# Patient Record
Sex: Male | Born: 1985 | Race: White | Hispanic: No | Marital: Married | Smoking: Current every day smoker
Health system: Southern US, Community
[De-identification: ages and names within clinical notes are randomized; demographics above are authoritative.]

## PROBLEM LIST (undated history)

## (undated) DIAGNOSIS — F419 Anxiety disorder, unspecified: Secondary | ICD-10-CM

## (undated) HISTORY — PX: BACK SURGERY: SHX140

## (undated) HISTORY — DX: Anxiety disorder, unspecified: F41.9

---

## 2015-05-16 DIAGNOSIS — G8929 Other chronic pain: Secondary | ICD-10-CM | POA: Insufficient documentation

## 2015-12-17 DIAGNOSIS — R251 Tremor, unspecified: Secondary | ICD-10-CM | POA: Insufficient documentation

## 2016-01-06 DIAGNOSIS — M5136 Other intervertebral disc degeneration, lumbar region: Secondary | ICD-10-CM | POA: Insufficient documentation

## 2016-01-06 DIAGNOSIS — G43009 Migraine without aura, not intractable, without status migrainosus: Secondary | ICD-10-CM | POA: Insufficient documentation

## 2019-09-27 DIAGNOSIS — R509 Fever, unspecified: Secondary | ICD-10-CM | POA: Diagnosis not present

## 2019-09-27 DIAGNOSIS — R0981 Nasal congestion: Secondary | ICD-10-CM | POA: Diagnosis not present

## 2019-09-27 DIAGNOSIS — Z20822 Contact with and (suspected) exposure to covid-19: Secondary | ICD-10-CM | POA: Diagnosis not present

## 2020-04-24 DIAGNOSIS — J069 Acute upper respiratory infection, unspecified: Secondary | ICD-10-CM | POA: Diagnosis not present

## 2020-04-24 DIAGNOSIS — J040 Acute laryngitis: Secondary | ICD-10-CM | POA: Diagnosis not present

## 2021-02-26 DIAGNOSIS — R0981 Nasal congestion: Secondary | ICD-10-CM | POA: Diagnosis not present

## 2021-02-26 DIAGNOSIS — J069 Acute upper respiratory infection, unspecified: Secondary | ICD-10-CM | POA: Diagnosis not present

## 2021-02-26 DIAGNOSIS — H938X1 Other specified disorders of right ear: Secondary | ICD-10-CM | POA: Diagnosis not present

## 2021-04-11 ENCOUNTER — Other Ambulatory Visit: Payer: Self-pay

## 2021-04-11 ENCOUNTER — Emergency Department (HOSPITAL_BASED_OUTPATIENT_CLINIC_OR_DEPARTMENT_OTHER)
Admission: EM | Admit: 2021-04-11 | Discharge: 2021-04-11 | Disposition: A | Discharge status: Home / Self Care | Payer: BC Managed Care – PPO | Attending: Student | Admitting: Student

## 2021-04-11 ENCOUNTER — Emergency Department (HOSPITAL_BASED_OUTPATIENT_CLINIC_OR_DEPARTMENT_OTHER): Payer: BC Managed Care – PPO

## 2021-04-11 ENCOUNTER — Encounter (HOSPITAL_BASED_OUTPATIENT_CLINIC_OR_DEPARTMENT_OTHER): Payer: Self-pay | Admitting: *Deleted

## 2021-04-11 DIAGNOSIS — F419 Anxiety disorder, unspecified: Secondary | ICD-10-CM | POA: Diagnosis not present

## 2021-04-11 DIAGNOSIS — F17210 Nicotine dependence, cigarettes, uncomplicated: Secondary | ICD-10-CM | POA: Insufficient documentation

## 2021-04-11 DIAGNOSIS — R079 Chest pain, unspecified: Secondary | ICD-10-CM | POA: Diagnosis not present

## 2021-04-11 DIAGNOSIS — X509XXA Other and unspecified overexertion or strenuous movements or postures, initial encounter: Secondary | ICD-10-CM | POA: Diagnosis not present

## 2021-04-11 DIAGNOSIS — R0789 Other chest pain: Secondary | ICD-10-CM | POA: Diagnosis not present

## 2021-04-11 LAB — CBC
HCT: 46.9 % (ref 39.0–52.0)
Hemoglobin: 15.6 g/dL (ref 13.0–17.0)
MCH: 29.2 pg (ref 26.0–34.0)
MCHC: 33.3 g/dL (ref 30.0–36.0)
MCV: 87.7 fL (ref 80.0–100.0)
Platelets: 204 10*3/uL (ref 150–400)
RBC: 5.35 MIL/uL (ref 4.22–5.81)
RDW: 13.1 % (ref 11.5–15.5)
WBC: 6.4 10*3/uL (ref 4.0–10.5)
nRBC: 0 % (ref 0.0–0.2)

## 2021-04-11 LAB — BASIC METABOLIC PANEL
Anion gap: 7 (ref 5–15)
BUN: 9 mg/dL (ref 6–20)
CO2: 28 mmol/L (ref 22–32)
Calcium: 9.2 mg/dL (ref 8.9–10.3)
Chloride: 103 mmol/L (ref 98–111)
Creatinine, Ser: 0.94 mg/dL (ref 0.61–1.24)
GFR, Estimated: >60 mL/min (ref >60)
Glucose, Bld: 78 mg/dL (ref 70–99)
Potassium: 3.8 mmol/L (ref 3.5–5.1)
Sodium: 138 mmol/L (ref 135–145)

## 2021-04-11 LAB — TROPONIN I (HIGH SENSITIVITY): Troponin I (High Sensitivity): 2 ng/L (ref <18)

## 2021-04-11 MED ORDER — HYDROXYZINE HCL 25 MG PO TABS
25.0000 mg | ORAL_TABLET | Freq: Once | ORAL | Status: AC
Start: 2021-04-11 — End: 2021-04-11
  Administered 2021-04-11: 25 mg via ORAL
  Filled 2021-04-11: qty 1

## 2021-04-11 MED ORDER — HYDROXYZINE HCL 25 MG PO TABS: 25.0000 mg | ORAL_TABLET | Freq: Four times a day (QID) | ORAL | 0 refills | Status: AC

## 2021-04-11 NOTE — ED Provider Notes (Signed)
?[facility] EMERGENCY DEPARTMENT ?Provider Note ? ?CSN: [id number] ?Arrival date & time: 04/11/21 2004 ? ?Chief Complaint(s) ?Chest Pain ? ?HPI ?Ronnie Adams is a 36 y.o. male who presents emergency department for evaluation of chest pain.  Patient states that his symptoms have been present for the last week and are directly associated with increasing stress at work.  States that his pain is primarily in the left upper quadrant of the abdomen and radiates up into the chest and down the left arm.  He states that his pain is nonexertional, not associated with shortness of breath, diaphoresis, nausea or vomiting.  Denies abdominal pain, headache, fever or other systemic symptoms.  Patient states that he "just needs some help" in regards to his anxiety at work. ? ? ?Chest Pain ? ?Past Medical History ?History reviewed. No pertinent past medical history. ?There are no problems to display for this patient. ? ?Home Medication(s) ?Prior to Admission medications   ?Medication Sig Start Date End Date Taking? Authorizing Provider  ?hydrOXYzine (ATARAX) 25 MG tablet Take 1 tablet (25 mg total) by mouth every 6 (six) hours. 04/11/21  Yes Cordell Coke, Wyn ForsterMadison, MD  ?                                                                                                                                  ?Past Surgical History ?Past Surgical History:  ?Procedure Laterality Date  ? BACK SURGERY    ? ?Family History ?No family history on file. ? ?Social History ?Social History  ? ?Tobacco Use  ? Smoking status: Every Day  ?  Types: Cigarettes  ? Smokeless tobacco: Never  ?Vaping Use  ? Vaping Use: Every day  ?Substance Use Topics  ? Alcohol use: Yes  ?  Comment: rare  ? Drug use: Never  ? ?Allergies ?Duloxetine, Oxycodone-acetaminophen, and Prednisone ? ?Review of Systems ?Review of Systems  ?Cardiovascular:  Positive for chest pain.  ?Psychiatric/Behavioral:  The patient is nervous/anxious.   ? ?Physical Exam ?Vital Signs  ?I have  reviewed the triage vital signs ?BP 124/84   Pulse 74   Temp 98.1 ?F (36.7 ?C)   Resp 14   Ht 6' (1.829 m)   Wt 82.8 kg   SpO2 100%   BMI 24.76 kg/m?  ? ?Physical Exam ?Vitals and nursing note reviewed.  ?Constitutional:   ?   General: He is not in acute distress. ?   Appearance: He is well-developed.  ?HENT:  ?   Head: Normocephalic and atraumatic.  ?Eyes:  ?   Conjunctiva/sclera: Conjunctivae normal.  ?Cardiovascular:  ?   Rate and Rhythm: Normal rate and regular rhythm.  ?   Heart sounds: No murmur heard. ?Pulmonary:  ?   Effort: Pulmonary effort is normal. No respiratory distress.  ?   Breath sounds: Normal breath sounds.  ?Abdominal:  ?   Palpations: Abdomen is soft.  ?   Tenderness: There is no abdominal  tenderness.  ?Musculoskeletal:     ?   General: No swelling.  ?   Cervical back: Neck supple.  ?Skin: ?   General: Skin is warm and dry.  ?   Capillary Refill: Capillary refill takes less than 2 seconds.  ?Neurological:  ?   Mental Status: He is alert.  ?Psychiatric:     ?   Mood and Affect: Mood normal.  ? ? ?ED Results and Treatments ?Labs ?(all labs ordered are listed, but only abnormal results are displayed) ?Labs Reviewed  ?BASIC METABOLIC PANEL  ?CBC  ?TROPONIN I (HIGH SENSITIVITY)  ?TROPONIN I (HIGH SENSITIVITY)  ?                                                                                                                       ? ?Radiology ?DG Chest 2 View ? ?Result Date: 04/11/2021 ?CLINICAL DATA:  Acute chest pain. EXAM: CHEST - 2 VIEW COMPARISON:  None. FINDINGS: The cardiomediastinal silhouette is unremarkable. There is no evidence of focal airspace disease, pulmonary edema, suspicious pulmonary nodule/mass, pleural effusion, or pneumothorax. No acute bony abnormalities are identified. IMPRESSION: No active cardiopulmonary disease. Electronically Signed   By: Margarette Canada M.D.   On: 04/11/2021 20:56   ? ?Pertinent labs & imaging results that were available during my care of the patient were  reviewed by me and considered in my medical decision making (see MDM for details). ? ?Medications Ordered in ED ?Medications  ?hydrOXYzine (ATARAX) tablet 25 mg (has no administration in time range)  ?                                                               ?                                                                    ?Procedures ?Procedures ? ?(including critical care time) ? ?Medical Decision Making / ED Course ? ? ?This patient presents to the ED for concern of chest pain, this involves an extensive number of treatment options, and is a complaint that carries with it a high risk of complications and morbidity.  The differential diagnosis includes anxiety, ACS, pneumonia, precordial catch, gastritis ? ?MDM: ?Patient seen emergency department for evaluation of chest pain.  Physical exam is unremarkable.  ECG nonischemic.  Chest x-ray negative.  High-sensitivity troponin negative.  Laboratory evaluation otherwise negative.  Patient's heart score is 0.  PERC negative.  I had a long discussion with the patient about his symptoms and they appear to  be directly related to increasing stress at work.  Patient is considering finding a new job, and we had a discussion about an Atarax trial.  He was given this medication prior to discharge and a prescription for as needed Atarax was given to the patient.  He was also given resources to follow-up with a new primary care physician and the behavioral health urgent care.  Patient then discharged. ? ? ?Additional history obtained: ?-Additional history obtained from wife ?-External records from outside source obtained and reviewed including: Chart review including previous notes, labs, imaging, consultation notes ? ? ?Lab Tests: ?-I ordered, reviewed, and interpreted labs.   ?The pertinent results include:   ?Labs Reviewed  ?BASIC METABOLIC PANEL  ?CBC  ?TROPONIN I (HIGH SENSITIVITY)  ?TROPONIN I (HIGH SENSITIVITY)  ?  ? ? ?EKG  ? EKG  Interpretation ? ?Date/Time:  Saturday April 11 2021 20:20:13 EDT ?Ventricular Rate:  93 ?PR Interval:  110 ?QRS Duration: 84 ?QT Interval:  342 ?QTC Calculation: 425 ?R Axis:   89 ?Text Interpretation: Sinus rhythm with short PR Abnormal ECG No previous ECGs available Confirmed by Mylena Sedberry ([id number]) on 04/11/2021 10:20:28 PM ?  ? ?  ? ? ? ?Imaging Studies ordered: ?I ordered imaging studies including CXR ?I independently visualized and interpreted imaging. ?I agree with the radiologist interpretation ? ? ?Medicines ordered and prescription drug management: ?Meds ordered this encounter  ?Medications  ? hydrOXYzine (ATARAX) 25 MG tablet  ?  Sig: Take 1 tablet (25 mg total) by mouth every 6 (six) hours.  ?  Dispense:  12 tablet  ?  Refill:  0  ? hydrOXYzine (ATARAX) tablet 25 mg  ?  ?-I have reviewed the patients home medicines and have made adjustments as needed ? ?Critical interventions ?none ? ?Cardiac Monitoring: ?The patient was maintained on a cardiac monitor.  I personally viewed and interpreted the cardiac monitored which showed an underlying rhythm of: NSR ? ?Social Determinants of Health:  ?Factors impacting patients care include: none ? ? ?Reevaluation: ?After the interventions noted above, I reevaluated the patient and found that they have :improved ? ?Co morbidities that complicate the patient evaluation ?History reviewed. No pertinent past medical history.  ? ? ?Dispostion: ?I considered admission for this patient, but with negative heart score, PERC negative and symptoms directly related to stress at work, he is safe for discharge with outpatient follow-up. ? ? ? ? ?Final Clinical Impression(s) / ED Diagnoses ?Final diagnoses:  ?Atypical chest pain  ?Anxiety  ? ? ? ?@PCDICTATION @ ? ?  ?Teressa Lower, MD ?04/11/21 2251 ? ?

## 2021-04-11 NOTE — ED Triage Notes (Signed)
Pt reports chest pain (points to LUQ of abdomen). States pain is sharp. Also states he has been having left arm/shoulder pain. Also intermittent episodes of "tingling" on left side of face over the last 2 weeks (none at present) ?

## 2021-04-13 ENCOUNTER — Other Ambulatory Visit: Payer: Self-pay

## 2021-04-13 ENCOUNTER — Ambulatory Visit: Payer: BC Managed Care – PPO | Admitting: Nurse Practitioner

## 2021-04-13 ENCOUNTER — Encounter: Payer: Self-pay | Admitting: Nurse Practitioner

## 2021-04-13 VITALS — BP 135/84 | HR 92 | Temp 97.9°F | Ht 72.0 in | Wt 186.0 lb

## 2021-04-13 DIAGNOSIS — F419 Anxiety disorder, unspecified: Secondary | ICD-10-CM

## 2021-04-13 MED ORDER — ESCITALOPRAM OXALATE 10 MG PO TABS: 10.0000 mg | ORAL_TABLET | Freq: Every day | ORAL | 0 refills | Status: DC

## 2021-04-13 NOTE — Patient Instructions (Signed)
1. Anxiety ? ?- escitalopram (LEXAPRO) 10 MG tablet; Take 1 tablet (10 mg total) by mouth daily.  Dispense: 30 tablet; Refill: 0 ? ?Will scheduled counseling ASAP ? ?Stay active ? ?Stay well hydrated ? ?Follow up: ? ?Follow up within 1 week for anxiety with Dr. [place] Pulling or Amy ? ?Generalized Anxiety Disorder, Adult ?Generalized anxiety disorder (GAD) is a mental health condition. Unlike normal worries, anxiety related to GAD is not triggered by a specific event. These worries do not fade or get better with time. GAD interferes with relationships, work, and school. ?GAD symptoms can vary from mild to severe. People with severe GAD can have intense waves of anxiety with physical symptoms that are similar to panic attacks. ?What are the causes? ?The exact cause of GAD is not known, but the following are believed to have an impact: ?Differences in natural brain chemicals. ?Genes passed down from parents to children. ?Differences in the way threats are perceived. ?Development and stress during childhood. ?Personality. ?What increases the risk? ?The following factors may make you more likely to develop this condition: ?Being male. ?Having a family history of anxiety disorders. ?Being very shy. ?Experiencing very stressful life events, such as the death of a loved one. ?Having a very stressful family environment. ?What are the signs or symptoms? ?People with GAD often worry excessively about many things in their lives, such as their health and family. Symptoms may also include: ?Mental and emotional symptoms: ?Worrying excessively about natural disasters. ?Fear of being late. ?Difficulty concentrating. ?Fears that others are judging your performance. ?Physical symptoms: ?Fatigue. ?Headaches, muscle tension, muscle twitches, trembling, or feeling shaky. ?Feeling like your heart is pounding or beating very fast. ?Feeling out of breath or like you cannot take a deep breath. ?Having trouble falling asleep or staying asleep, or  experiencing restlessness. ?Sweating. ?Nausea, diarrhea, or irritable bowel syndrome (IBS). ?Behavioral symptoms: ?Experiencing erratic moods or irritability. ?Avoidance of new situations. ?Avoidance of people. ?Extreme difficulty making decisions. ?How is this diagnosed? ?This condition is diagnosed based on your symptoms and medical history. You will also have a physical exam. Your health care provider may perform tests to rule out other possible causes of your symptoms. ?To be diagnosed with GAD, a person must have anxiety that: ?Is out of his or her control. ?Affects several different aspects of his or her life, such as work and relationships. ?Causes distress that makes him or her unable to take part in normal activities. ?Includes at least three symptoms of GAD, such as restlessness, fatigue, trouble concentrating, irritability, muscle tension, or sleep problems. ?Before your health care provider can confirm a diagnosis of GAD, these symptoms must be present more days than they are not, and they must last for 6 months or longer. ?How is this treated? ?This condition may be treated with: ?Medicine. Antidepressant medicine is usually prescribed for long-term daily control. Anti-anxiety medicines may be added in severe cases, especially when panic attacks occur. ?Talk therapy (psychotherapy). Certain types of talk therapy can be helpful in treating GAD by providing support, education, and guidance. Options include: ?Cognitive behavioral therapy (CBT). People learn coping skills and self-calming techniques to ease their physical symptoms. They learn to identify unrealistic thoughts and behaviors and to replace them with more appropriate thoughts and behaviors. ?Acceptance and commitment therapy (ACT). This treatment teaches people how to be mindful as a way to cope with unwanted thoughts and feelings. ?Biofeedback. This process trains you to manage your body's response (physiological response) through breathing  techniques  and relaxation methods. You will work with a therapist while machines are used to monitor your physical symptoms. ?Stress management techniques. These include yoga, meditation, and exercise. ?A mental health specialist can help determine which treatment is best for you. Some people see improvement with one type of therapy. However, other people require a combination of therapies. ?Follow these instructions at home: ?Lifestyle ?Maintain a consistent routine and schedule. ?Anticipate stressful situations. Create a plan and allow extra time to work with your plan. ?Practice stress management or self-calming techniques that you have learned from your therapist or your health care provider. ?Exercise regularly and spend time outdoors. ?Eat a healthy diet that includes plenty of vegetables, fruits, whole grains, low-fat dairy products, and lean protein. ?Do not eat a lot of foods that are high in fat, added sugar, or salt (sodium). ?Drink plenty of water. ?Avoid alcohol. Alcohol can increase anxiety. ?Avoid caffeine and certain over-the-counter cold medicines. These may make you feel worse. Ask your pharmacist which medicines to avoid. ?General instructions ?Take over-the-counter and prescription medicines only as told by your health care provider. ?Understand that you are likely to have setbacks. Accept this and be kind to yourself as you persist to take better care of yourself. ?Anticipate stressful situations. Create a plan and allow extra time to work with your plan. ?Recognize and accept your accomplishments, even if you judge them as small. ?Spend time with people who care about you. ?Keep all follow-up visits. This is important. ?Where to find more information ?[employer] of Mental Health: https://carter.com/ ?Substance Abuse and Mental Health Services: ktimeonline.com ?Contact a health care provider if: ?Your symptoms do not get better. ?Your symptoms get worse. ?You have signs of depression, such  as: ?A persistently sad or irritable mood. ?Loss of enjoyment in activities that used to bring you joy. ?Change in weight or eating. ?Changes in sleeping habits. ?Get help right away if: ?You have thoughts about hurting yourself or others. ?If you ever feel like you may hurt yourself or others, or have thoughts about taking your own life, get help right away. Go to your nearest emergency department or: ?Call your local emergency services (911 in the [place] ?Call a suicide crisis helpline, such as the [facility] at 402-187-4873 or 988 in the [place]. This is open 24 hours a day in the [place] ?Text the Crisis Text Line at 432-047-7732 (in the [place].). ?Summary ?Generalized anxiety disorder (GAD) is a mental health condition that involves worry that is not triggered by a specific event. ?People with GAD often worry excessively about many things in their lives, such as their health and family. ?GAD may cause symptoms such as restlessness, trouble concentrating, sleep problems, frequent sweating, nausea, diarrhea, headaches, and trembling or muscle twitching. ?A mental health specialist can help determine which treatment is best for you. Some people see improvement with one type of therapy. However, other people require a combination of therapies. ?This information is not intended to replace advice given to you by your health care provider. Make sure you discuss any questions you have with your health care provider. ?Document Revised: 08/06/2020 Document Reviewed: 05/04/2020 ?Elsevier Patient Education ? 2022 [place]. ? ? ?

## 2021-04-13 NOTE — Progress Notes (Signed)
@Patient  ID: , male    DOB: Feb 09, 1985, 36 y.o.   MRN: [id number] ? ?Chief Complaint  ?Patient presents with  ? Hospitalization Follow-up  ?  Atypical chest pain/anxiety  ? ? ?Referring provider: ?No ref. provider found ? ? ?HPI ? ?Patient presents today with anxiety.  He states that this has been progressively worsening over the last 3 to 4 weeks.  He states that his stress and anxiety is job-related.  He also states that he recently lost his grandmother about a week ago.  He states this got to the point where it is hard to cope with anxiety at work.  He is reaching out for help because he is worried that he may harm someone.  We discussed that we can start him on medication for anxiety and set him up with counseling.  We discussed that he may need to start looking for a new job. Denies f/c/s, n/v/d, hemoptysis, PND, chest pain or edema. ? ? ? ? ?Allergies  ?Allergen Reactions  ? Duloxetine Rash  ?  Other reaction(s): Other ?Jittery/incontinence ?Jittery/incontinence ?Jittery/incontinence ?  ? Oxycodone-Acetaminophen Nausea Only  ?  Other reaction(s): Dizziness  ? Prednisone Anxiety  ?  Other reaction(s): Other ?"head feels funny/flush" ?"head feels funny/flush" ?"head feels funny/flush" ?"head feels funny/flush" ?  ? ? ? ?There is no immunization history on file for this patient. ? ?History reviewed. No pertinent past medical history. ? ?Tobacco History: ?Social History  ? ?Tobacco Use  ?Smoking Status Every Day  ? Types: Cigarettes  ?Smokeless Tobacco Never  ? ?Ready to quit: Not Answered ?Counseling given: Not Answered ? ? ?Outpatient Encounter Medications as of 04/13/2021  ?Medication Sig  ? escitalopram (LEXAPRO) 10 MG tablet Take 1 tablet (10 mg total) by mouth daily.  ? hydrOXYzine (ATARAX) 25 MG tablet Take 1 tablet (25 mg total) by mouth every 6 (six) hours.  ? ?No facility-administered encounter medications on file as of 04/13/2021.  ? ? ? ?Review of Systems ? ?Review of Systems   ?Constitutional: Negative.   ?HENT: Negative.    ?Cardiovascular: Negative.   ?Gastrointestinal: Negative.   ?Allergic/Immunologic: Negative.   ?Neurological: Negative.   ?Psychiatric/Behavioral:  The patient is nervous/anxious.    ? ? ? ?Physical Exam ? ?BP 135/84 (BP Location: Left Arm, Patient Position: Sitting, Cuff Size: Normal)   Pulse 92   Temp 97.9 ?F (36.6 ?C) (Oral)   Ht 6' (1.829 m)   Wt 186 lb (84.4 kg)   SpO2 97%   BMI 25.23 kg/m?  ? ?Wt Readings from Last 5 Encounters:  ?04/13/21 186 lb (84.4 kg)  ?04/11/21 182 lb 8.7 oz (82.8 kg)  ? ? ? ?Physical Exam ?Vitals and nursing note reviewed.  ?Constitutional:   ?   General: He is not in acute distress. ?   Appearance: He is well-developed.  ?Cardiovascular:  ?   Rate and Rhythm: Normal rate and regular rhythm.  ?Pulmonary:  ?   Effort: Pulmonary effort is normal.  ?   Breath sounds: Normal breath sounds.  ?Skin: ?   General: Skin is warm and dry.  ?Neurological:  ?   Mental Status: He is alert and oriented to person, place, and time.  ? ? ? ?Lab Results: ? ?CBC ?   ?Component Value Date/Time  ? WBC 6.4 04/11/2021 2102  ? RBC 5.35 04/11/2021 2102  ? HGB 15.6 04/11/2021 2102  ? HCT 46.9 04/11/2021 2102  ? PLT 204 04/11/2021 2102  ? MCV 87.7 04/11/2021 2102  ?  MCH 29.2 04/11/2021 2102  ? MCHC 33.3 04/11/2021 2102  ? RDW 13.1 04/11/2021 2102  ? ? ?BMET ?   ?Component Value Date/Time  ? NA 138 04/11/2021 2102  ? K 3.8 04/11/2021 2102  ? CL 103 04/11/2021 2102  ? CO2 28 04/11/2021 2102  ? GLUCOSE 78 04/11/2021 2102  ? BUN 9 04/11/2021 2102  ? CREATININE 0.94 04/11/2021 2102  ? CALCIUM 9.2 04/11/2021 2102  ? GFRNONAA >60 04/11/2021 2102  ? ? ?BNP ?No results found for: BNP ? ?ProBNP ?No results found for: PROBNP ? ?Imaging: ?DG Chest 2 View ? ?Result Date: 04/11/2021 ?CLINICAL DATA:  Acute chest pain. EXAM: CHEST - 2 VIEW COMPARISON:  None. FINDINGS: The cardiomediastinal silhouette is unremarkable. There is no evidence of focal airspace disease, pulmonary  edema, suspicious pulmonary nodule/mass, pleural effusion, or pneumothorax. No acute bony abnormalities are identified. IMPRESSION: No active cardiopulmonary disease. Electronically Signed   By: Harmon Pier M.D.   On: 04/11/2021 20:56   ? ? ?Assessment & Plan:  ? ?Anxiety ?- escitalopram (LEXAPRO) 10 MG tablet; Take 1 tablet (10 mg total) by mouth daily.  Dispense: 30 tablet; Refill: 0 ? ?Will scheduled counseling ASAP ? ?Stay active ? ?Stay well hydrated ? ?Follow up: ? ?Follow up within 1 week for anxiety with Dr. Andrey Campanile or Amy ? ? ? ? ?Ronnie Andrew, NP ?04/14/2021 ? ?

## 2021-04-14 ENCOUNTER — Ambulatory Visit: Payer: BC Managed Care – PPO | Admitting: Clinical

## 2021-04-14 ENCOUNTER — Encounter: Payer: Self-pay | Admitting: Nurse Practitioner

## 2021-04-14 DIAGNOSIS — F32A Depression, unspecified: Secondary | ICD-10-CM | POA: Insufficient documentation

## 2021-04-14 DIAGNOSIS — F419 Anxiety disorder, unspecified: Secondary | ICD-10-CM | POA: Insufficient documentation

## 2021-04-14 DIAGNOSIS — F4323 Adjustment disorder with mixed anxiety and depressed mood: Secondary | ICD-10-CM

## 2021-04-14 DIAGNOSIS — F4322 Adjustment disorder with anxiety: Secondary | ICD-10-CM | POA: Diagnosis not present

## 2021-04-14 NOTE — Patient Instructions (Signed)
Incorporate self-care daily for at least 30 minutes ?"Don't worry about what you can't control"? ?Continue adhering to medication ?Utilize deep breathing exercises ?Place one hand on your upper chest and the other hand on your belly, below the ribcage. ?Allow your belly to relax, without forcing it inward by squeezing or clenching your muscles. ?Breathe in slowly through your nose. The air should move into your nose and downward so that you feel your stomach rise with your other hand and fall inward (toward your spine). ?Exhale slowly through slightly pursed lips. Take note of the hand on your chest, which should remain relatively still. ? ?Position your right hand by bending your pointer and middle fingers into your palm, leaving your thumb, ring finger, and pinky extended. This is known as Ambulance person in yoga. ?Close your eyes or softly gaze downward. ?Inhale and exhale to begin. ?Close off your right nostril with your thumb. ?Inhale through your left nostril. ?Close off your left nostril with your ring finger. ?Open and exhale through your right nostril. ?Inhale through your right nostril. ?Close off your right nostril with your thumb. ?Open and exhale through your left nostril. ?Inhale through your left nostril. ?

## 2021-04-14 NOTE — Assessment & Plan Note (Signed)
-   escitalopram (LEXAPRO) 10 MG tablet; Take 1 tablet (10 mg total) by mouth daily.  Dispense: 30 tablet; Refill: 0 ? ?Will scheduled counseling ASAP ? ?Stay active ? ?Stay well hydrated ? ?Follow up: ? ?Follow up within 1 week for anxiety with Dr. Andrey Campanile or Amy ?

## 2021-04-14 NOTE — Progress Notes (Signed)
? ? ?Patient ID: Ronnie Adams, male    DOB: May 29, 1985  MRN: [id number] ? ?CC: Anxiety Follow-Up  ? ?Subjective: ?Ronnie Adams is a 36 y.o. male who presents for anxiety follow-up. He is accompanied by his wife, Marylene Land.  ? ?His concerns today include:  ?ANXIETY FOLLOW-UP: ?04/13/2021 with Angus Seller, NP: ?- escitalopram (LEXAPRO) 10 MG tablet; Take 1 tablet (10 mg total) by mouth daily.  Dispense: 30 tablet; Refill: 0 ?Will scheduled counseling ASAP ?Stay active ?Stay well hydrated ?  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  Feels his leadership is asking him to do unethical things and asking him to have who he supervisors do it if he doesn't want to do it. States he doesn't feel comfortable asking anyone to do something he wouldn't do. Reports employer calling and/or texting him even when he is off the clock. Wife notices patient becomes tense when speaking about his job for example he wrings his hands.  ? ?Doesn't feel any improvement with Lexapro as of present. Reports does make him sleepy. Counseling going well. Not ready for referral to Psychiatry as of present. Denies thoughts of self-harm, suicidal ideations, homicidal ideations. Would like work letter so that he has more time to recover at home. Reports he doesn't want to do or say anything at work that he may regret stating "I do not want to blow up". Plans to have FMLA paperwork sent to primary care for completion soon.  ? ? ?  04/14/2021  ?  9:56 AM 04/13/2021  ?  1:28 PM  ?Depression screen PHQ 2/9  ?Decreased Interest 3 3  ?Down, Depressed, Hopeless 3 3  ?PHQ - 2 Score 6 6  ?Altered sleeping 3 3  ?Tired, decreased energy 3 3  ?Change in appetite 3 3  ?Feeling bad or failure about yourself  2 3  ?Trouble concentrating 2 2  ?Moving slowly or fidgety/restless 0 2  ?Suicidal thoughts 0 0  ?PHQ-9 Score 19 22  ?Difficult doing work/chores  Extremely dIfficult  ? ? ?Patient Active Problem List  ?  Diagnosis Date Noted  ? Anxiety 04/14/2021  ? Bulging lumbar disc 01/06/2016  ? Migraine without aura and without status migrainosus, not intractable 01/06/2016  ? Functional gait disorder with tremor 12/17/2015  ? Chronic bilateral low back pain with bilateral sciatica 05/16/2015  ?  ? ?Current Outpatient Medications on File Prior to Visit  ?Medication Sig Dispense Refill  ? hydrOXYzine (ATARAX) 25 MG tablet Take 1 tablet (25 mg total) by mouth every 6 (six) hours. 12 tablet 0  ? ?No current facility-administered medications on file prior to visit.  ? ? ?Allergies  ?Allergen Reactions  ? Duloxetine Rash  ?  Other reaction(s): Other ?Jittery/incontinence ?Jittery/incontinence ?Jittery/incontinence ?  ? Oxycodone-Acetaminophen Nausea Only  ?  Other reaction(s): Dizziness  ? Prednisone Anxiety  ?  Other reaction(s): Other ?"head feels funny/flush" ?"head feels funny/flush" ?"head feels funny/flush" ?"head feels funny/flush" ?  ? ? ?Social History  ? ?Socioeconomic History  ? Marital status: Married  ?  Spouse name: Not on file  ? Number of children: Not on file  ? Years of education: Not on file  ? Highest education level: Not on file  ?Occupational History  ? Not on file  ?Tobacco Use  ? Smoking status: Every Day  ?  Types: Cigarettes  ?  Passive exposure: Current  ? Smokeless tobacco: Never  ?Vaping Use  ? Vaping Use: Every day  ?  Substance and Sexual Activity  ? Alcohol use: Yes  ?  Comment: rare  ? Drug use: Never  ? Sexual activity: Not on file  ?Other Topics Concern  ? Not on file  ?Social History Narrative  ? Not on file  ? ?Social Determinants of Health  ? ?Financial Resource Strain: Not on file  ?Food Insecurity: Not on file  ?Transportation Needs: Not on file  ?Physical Activity: Not on file  ?Stress: Not on file  ?Social Connections: Not on file  ?Intimate Partner Violence: Not on file  ? ? ?No family history on file. ? ?Past Surgical History:  ?Procedure Laterality Date  ? BACK SURGERY    ? ? ?ROS: ?Review  of Systems ?Negative except as stated above ? ?PHYSICAL EXAM: ?Temp 98.3 ?F (36.8 ?C)   Ht 6' 0.01" (1.829 m)   Wt 180 lb (81.6 kg)   BMI 24.41 kg/m?  ? ?Physical Exam ?HENT:  ?   Head: Normocephalic and atraumatic.  ?Eyes:  ?   Extraocular Movements: Extraocular movements intact.  ?   Conjunctiva/sclera: Conjunctivae normal.  ?   Pupils: Pupils are equal, round, and reactive to light.  ?Cardiovascular:  ?   Rate and Rhythm: Normal rate and regular rhythm.  ?   Pulses: Normal pulses.  ?   Heart sounds: Normal heart sounds.  ?Pulmonary:  ?   Effort: Pulmonary effort is normal.  ?   Breath sounds: Normal breath sounds.  ?Musculoskeletal:  ?   Cervical back: Normal range of motion and neck supple.  ?Neurological:  ?   General: No focal deficit present.  ?   Mental Status: He is alert and oriented to person, place, and time.  ?Psychiatric:     ?   Mood and Affect: Mood normal.     ?   Behavior: Behavior normal.  ? ? ?ASSESSMENT AND PLAN: ?1. Generalized anxiety disorder: ?- Patient denies thoughts of self-harm, suicidal ideations, homicidal ideations. ?- Continue Escitalopram as prescribed. Discussed it may take 4 to 6 weeks to feel improvement of anxiety. Since patient has only been taking the same since 04/13/2021 will allow more time to see if improved.  ?- Patient declined referral to Psychiatry.  ?- Keep all scheduled appointments with Asante McCoy, LCSW.  ?- Patient provided with work letter as requested.  ?- Follow-up with primary provider in 4 weeks or sooner if needed.  ?- escitalopram (LEXAPRO) 10 MG tablet; Take 1 tablet (10 mg total) by mouth daily.  Dispense: 30 tablet; Refill: 1 ? ?2. Encounter for completion of form with patient: ?- Patient aware completion of FMLA paperwork requires appointment. Welcome to schedule when best for him. ? ? ?Patient was given the opportunity to ask questions.  Patient verbalized understanding of the plan and was able to repeat key elements of the plan. Patient was given  clear instructions to go to Emergency Department or return to medical center if symptoms don't improve, worsen, or new problems develop.The patient verbalized understanding. ? ?Requested Prescriptions  ? ?Signed Prescriptions Disp Refills  ? escitalopram (LEXAPRO) 10 MG tablet 30 tablet 1  ?  Sig: Take 1 tablet (10 mg total) by mouth daily.  ? ? ?Return in about 4 weeks (around 05/15/2021) for Follow-Up or next available anxiety; Paperwork appt when best for patient . ? ?Rema Fendt, NP  ?

## 2021-04-15 NOTE — BH Specialist Note (Signed)
Integrated Behavioral Health Initial In-Person Visit ? ?MRN: [id number] ?Name: Ronnie Adams ? ?Number of [facility] Clinician visits: 1- Initial Visit ? ?Session Start time: [phone] ?   ?Session End time: [id number] ? ?Total time in minutes: 55 ? ? ?Types of Service: Individual psychotherapy ? ?Interpretor:No. Interpretor Name and Language: N/A ? ? Warm Hand Off Completed. ?  ? ?  ? ? ?Subjective: ?Ronnie Adams is a 36 y.o. male accompanied by  self ?Patient was referred by Lazaro Arms, NP for stress and anxiety. ?Patient reports the following symptoms/concerns: Reports feeling anxious, excessive worrying, trouble relaxing, restlessness, irritability, feeling depressed, trouble sleeping, decreased energy, decreased appetite, and trouble sleeping. Reports that he has been stressed due to his job that he has been at for 5 years. Reports that he is overworked and his job frequently calls him during times that he is off. Reports that he does not get much sleep during the day when he is supposed to sleep. Reports that he is searching for a new job. ?Duration of problem: 2 weeks; Severity of problem: moderate ? ?Objective: ?Mood: Anxious and Affect: Appropriate ?Risk of harm to self or others: No plan to harm self or others ? ?Life Context: ?Family and Social: Pt is married with two children. ?School/Work: Pt is employed full time and is searching for new employment.  ?Self-Care: Pt has limited self-care. Pt currently prescribed lexapro.  ?Life Changes: Pt has been stressed due to work. ? ?Patient and/or Family's Strengths/Protective Factors: ?Concrete supports in place (healthy food, safe environments, etc.) and Sense of purpose ? ?Goals Addressed: ?Patient will: ?Reduce symptoms of: anxiety, depression, and stress ?Increase knowledge and/or ability of: coping skills and stress reduction  ?Demonstrate ability to: Increase healthy adjustment to current life circumstances ? ?Progress towards  Goals: ?Ongoing ? ?Interventions: ?Interventions utilized: Optician, dispensing, CBT Cognitive Behavioral Therapy, and Supportive Counseling  ?Standardized Assessments completed: GAD-7 and PHQ 9 ? ?  04/14/2021  ?  9:57 AM 04/13/2021  ?  1:29 PM  ?GAD 7 : Generalized Anxiety Score  ?Nervous, Anxious, on Edge 3 3  ?Control/stop worrying 3 3  ?Worry too much - different things 3 3  ?Trouble relaxing 3 3  ?Restless 3 3  ?Easily annoyed or irritable 3 3  ?Afraid - awful might happen 3 3  ?Total GAD 7 Score 21 21  ?Anxiety Difficulty  Extremely difficult  ? ?  ? ?  04/14/2021  ?  9:56 AM 04/13/2021  ?  1:28 PM  ?Depression screen PHQ 2/9  ?Decreased Interest 3 3  ?Down, Depressed, Hopeless 3 3  ?PHQ - 2 Score 6 6  ?Altered sleeping 3 3  ?Tired, decreased energy 3 3  ?Change in appetite 3 3  ?Feeling bad or failure about yourself  2 3  ?Trouble concentrating 2 2  ?Moving slowly or fidgety/restless 0 2  ?Suicidal thoughts 0 0  ?PHQ-9 Score 19 22  ?Difficult doing work/chores  Extremely dIfficult  ?  ?Patient and/or Family Response: Pt receptive to tx. Pt receptive to psychoeducation provided on stress, anxiety, depression and its association with physical health problems. Pt receptive to cognitive restructuring to decrease unhelpful thoughts. Pt receptive to identifying a pattern in pt's thoughts and behaviors. Pt receptive to daily self-care, mindfulness with deep breathing, and adhering to medication. ? ?Patient Centered Plan: ?Patient is on the following Treatment Plan(s): Stress ? ?Assessment: ?Denies SI/HI. Denies auditory/visual hallucinations. Patient currently experiencing stress which is causing anxiety and depression. Pt  appears to be overwhelmed with current employment. Pt appears to frequently put his job before his needs. Pt has limited self-care.  ?  ?Patient may benefit from establishing boundaries with place of employment. LCSW provided psychoeducation on stress, anxiety, and depression and its  association with physical health. LCSW utilized cognitive restructuring to decrease unhelpful thoughts. LCSW encouraged pt to establish healthy boundaries with job. LCSW encouraged pt to incorporate daily self-care, mindfulness with deep breathing, and adhering to medication. ? ?Plan: ?Follow up with behavioral health clinician on : 04/28/21 ?Behavioral recommendations: Adhere to medication, incorporate daily self-care, and utilize mindfulness. ?Referral(s): [place] (In Clinic) ?"From scale of 1-10, how likely are you to follow plan?": 10 ? ?Shaunta Oncale C Aashvi Rezabek, LCSW ? ? ? ? ? ? ? ? ?

## 2021-04-17 ENCOUNTER — Other Ambulatory Visit: Payer: Self-pay

## 2021-04-17 ENCOUNTER — Encounter: Payer: Self-pay | Admitting: Family

## 2021-04-17 ENCOUNTER — Ambulatory Visit: Payer: BC Managed Care – PPO | Admitting: Family

## 2021-04-17 VITALS — Temp 98.3°F | Ht 72.01 in | Wt 180.0 lb

## 2021-04-17 DIAGNOSIS — Z0289 Encounter for other administrative examinations: Secondary | ICD-10-CM

## 2021-04-17 DIAGNOSIS — F411 Generalized anxiety disorder: Secondary | ICD-10-CM | POA: Diagnosis not present

## 2021-04-17 MED ORDER — ESCITALOPRAM OXALATE 10 MG PO TABS
10.0000 mg | ORAL_TABLET | Freq: Every day | ORAL | 1 refills | Status: DC
Start: 2021-04-17 — End: 2021-05-15

## 2021-04-17 NOTE — Progress Notes (Signed)
Pt presents for anxiety follow up and needs to establish with PCP  ?

## 2021-04-20 ENCOUNTER — Telehealth: Payer: Self-pay | Admitting: Family

## 2021-04-20 NOTE — Telephone Encounter (Signed)
Pt dropped off FMLA docs and scheduled next available appt for FMLA docs to be filled w/ PCP. Docs are in PCP bin. Thank you.  ?

## 2021-04-20 NOTE — Progress Notes (Signed)
? ? ?Patient ID: Ronnie Adams, male    DOB: 1985/09/04  MRN: [id number] ? ?CC: FMLA Paperwork ? ?Subjective: ?Ronnie Adams is a 36 y.o. male who presents for FMLA paperwork.  ? ?His concerns today include:  ?Presents today with FMLA paperwork for completion. Employed at [employer] at the [place] location. 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 His schedule is 8 pm to 8 am for 36 to 48 hours weekly. Reports anxiety depression primarily related to his job.  ? ? ?  04/14/2021  ?  9:56 AM 04/13/2021  ?  1:28 PM  ?Depression screen PHQ 2/9  ?Decreased Interest 3 3  ?Down, Depressed, Hopeless 3 3  ?PHQ - 2 Score 6 6  ?Altered sleeping 3 3  ?Tired, decreased energy 3 3  ?Change in appetite 3 3  ?Feeling bad or failure about yourself  2 3  ?Trouble concentrating 2 2  ?Moving slowly or fidgety/restless 0 2  ?Suicidal thoughts 0 0  ?PHQ-9 Score 19 22  ?Difficult doing work/chores  Extremely dIfficult  ? ? ?Patient Active Problem List  ? Diagnosis Date Noted  ? Anxiety and depression 04/14/2021  ? Bulging lumbar disc 01/06/2016  ? Migraine without aura and without status migrainosus, not intractable 01/06/2016  ? Functional gait disorder with tremor 12/17/2015  ? Chronic bilateral low back pain with bilateral sciatica 05/16/2015  ?  ? ?Current Outpatient Medications on File Prior to Visit  ?Medication Sig Dispense Refill  ? escitalopram (LEXAPRO) 10 MG tablet Take 1 tablet (10 mg total) by mouth daily. 30 tablet 1  ? hydrOXYzine (ATARAX) 25 MG tablet Take 1 tablet (25 mg total) by mouth every 6 (six) hours. 12 tablet 0  ? ?No current facility-administered medications on file prior to visit.  ? ? ?Allergies  ?Allergen Reactions  ? Duloxetine Rash  ?  Other reaction(s): Other ?Jittery/incontinence ?Jittery/incontinence ?Jittery/incontinence ?  ? Oxycodone-Acetaminophen Nausea Only  ?  Other reaction(s): Dizziness  ? Prednisone Anxiety  ?  Other reaction(s): Other ?"head feels funny/flush" ?"head feels funny/flush" ?"head  feels funny/flush" ?"head feels funny/flush" ?  ? ? ?Social History  ? ?Socioeconomic History  ? Marital status: Married  ?  Spouse name: Not on file  ? Number of children: Not on file  ? Years of education: Not on file  ? Highest education level: Not on file  ?Occupational History  ? Not on file  ?Tobacco Use  ? Smoking status: Every Day  ?  Types: Cigarettes  ?  Passive exposure: Current  ? Smokeless tobacco: Never  ?Vaping Use  ? Vaping Use: Every day  ?Substance and Sexual Activity  ? Alcohol use: Yes  ?  Comment: rare  ? Drug use: Never  ? Sexual activity: Not on file  ?Other Topics Concern  ? Not on file  ?Social History Narrative  ? Not on file  ? ?Social Determinants of Health  ? ?Financial Resource Strain: Not on file  ?Food Insecurity: Not on file  ?Transportation Needs: Not on file  ?Physical Activity: Not on file  ?Stress: Not on file  ?Social Connections: Not on file  ?Intimate Partner Violence: Not on file  ? ? ?No family history on file. ? ?Past Surgical History:  ?Procedure Laterality Date  ? BACK SURGERY    ? ? ?ROS: ?Review of Systems ?Negative except as stated above ? ?PHYSICAL EXAM: ?BP 125/85 (BP Location: Left Arm, Patient Position: Sitting, Cuff Size: Normal)   Pulse 98   Temp 98.3 ?F (  36.8 ?C)   Resp 18   Ht 6' 0.01" (1.829 m)   Wt 185 lb (83.9 kg)   SpO2 98%   BMI 25.08 kg/m?  ? ?Physical Exam ?HENT:  ?   Head: Normocephalic and atraumatic.  ?Eyes:  ?   Extraocular Movements: Extraocular movements intact.  ?   Conjunctiva/sclera: Conjunctivae normal.  ?   Pupils: Pupils are equal, round, and reactive to light.  ?Cardiovascular:  ?   Rate and Rhythm: Normal rate and regular rhythm.  ?   Pulses: Normal pulses.  ?   Heart sounds: Normal heart sounds.  ?Pulmonary:  ?   Effort: Pulmonary effort is normal.  ?   Breath sounds: Normal breath sounds.  ?Musculoskeletal:  ?   Cervical back: Normal range of motion and neck supple.  ?Neurological:  ?   Mental Status: He is alert.  ?Psychiatric:      ?   Mood and Affect: Mood normal.     ?   Behavior: Behavior normal.  ? ? ?ASSESSMENT AND PLAN: ?1. Encounter for completion of form with patient: ?- FMLA paperwork for 12 weeks completed today in office.  ? ?2. Anxiety and depression: ?- Counseled patient will need Psychiatry referral to determine how often medically necessary for the employee to be absent from work on an intermittent basis (periodically) including episodes of incapacity, episodic flare-ups including frequency and duration. Explained this is needed so that FMLA can be completed in accuracy. Patient verbalized understanding.  ?- Referral to Psychiatry for further evaluation and management.  ?- Ambulatory referral to Psychiatry ? ?Patient was given the opportunity to ask questions.  Patient verbalized understanding of the plan and was able to repeat key elements of the plan. Patient was given clear instructions to go to Emergency Department or return to medical center if symptoms don't improve, worsen, or new problems develop.The patient verbalized understanding. ? ? ?Orders Placed This Encounter  ?Procedures  ? Ambulatory referral to Psychiatry  ? ? ?Requested Prescriptions  ? ? No prescriptions requested or ordered in this encounter  ? ? ?Follow-up with primary provider as scheduled. ? ?Camillia Herter, NP  ?

## 2021-04-20 NOTE — Telephone Encounter (Signed)
Pt is calling to schedule appt to go over [facility] paperwork. No available appts until 05/19/21. Pt needs to return paperwork by 05/01/21. Pt stated that he will drop off the FMLA paper to Amy. ?Please advise CB= [phone] ?

## 2021-04-22 ENCOUNTER — Other Ambulatory Visit: Payer: Self-pay

## 2021-04-22 ENCOUNTER — Ambulatory Visit (INDEPENDENT_AMBULATORY_CARE_PROVIDER_SITE_OTHER): Payer: BC Managed Care – PPO | Admitting: Family

## 2021-04-22 VITALS — BP 125/85 | HR 98 | Temp 98.3°F | Resp 18 | Ht 72.01 in | Wt 185.0 lb

## 2021-04-22 DIAGNOSIS — F32A Depression, unspecified: Secondary | ICD-10-CM

## 2021-04-22 DIAGNOSIS — F419 Anxiety disorder, unspecified: Secondary | ICD-10-CM | POA: Diagnosis not present

## 2021-04-22 DIAGNOSIS — Z0289 Encounter for other administrative examinations: Secondary | ICD-10-CM

## 2021-04-22 NOTE — Progress Notes (Signed)
Pt presents for FMLA paperwork completion 

## 2021-04-24 ENCOUNTER — Other Ambulatory Visit: Payer: Self-pay | Admitting: Family

## 2021-04-28 ENCOUNTER — Ambulatory Visit (INDEPENDENT_AMBULATORY_CARE_PROVIDER_SITE_OTHER): Payer: BC Managed Care – PPO | Admitting: Clinical

## 2021-04-28 DIAGNOSIS — F4323 Adjustment disorder with mixed anxiety and depressed mood: Secondary | ICD-10-CM | POA: Diagnosis not present

## 2021-04-28 NOTE — BH Specialist Note (Signed)
Integrated Behavioral Health Follow Up In-Person Visit ? ?MRN: [id number] ?Name: Ronnie Adams ? ?Number of Integrated Behavioral Health Clinician visits: 2- Second Visit ? ?Session Start time: 46 ?  ?Session End time: 0910 ? ?Total time in minutes: 30 ? ? ?Types of Service: Individual psychotherapy ? ?Interpretor:No. Interpretor Name and Language: N/A ? ?Subjective: ?Ronnie Adams is a 36 y.o. male accompanied by  self ?Patient was referred by Angus Seller, NP for stress and anxiety. ?Patient reports the following symptoms/concerns: Reports feeling anxious, excessive worrying, irritability, feeling depressed, trouble sleeping, decreased energy, self-esteem disturbance, and trouble concentrating. Reports that he has been taking time off from work. Reports that he is still searching for new employment.  Reports that he has seen an improvement in his sleep since he stopped working and that he has been able to spend more time with his family. Reports that he does feel bad due to not working.  ?Duration of problem: 1 month; Severity of problem: moderate ? ?Objective: ?Mood: Anxious and Affect: Appropriate ?Risk of harm to self or others: No plan to harm self or others ? ?Life Context: ?Family and Social: Pt is married with two children. ?School/Work: Pt is employed full time and is searching for new employment.  ?Self-Care: Pt has limited self-care. Pt currently prescribed lexapro.  ?Life Changes: Pt has been stressed due to work. ?(No changes to life context) ? ?Patient and/or Family's Strengths/Protective Factors: ?Concrete supports in place (healthy food, safe environments, etc.) and Sense of purpose ? ?Goals Addressed: ?Patient will: ? Reduce symptoms of: anxiety, depression, and stress  ? Increase knowledge and/or ability of: coping skills and stress reduction  ? Demonstrate ability to: Increase healthy adjustment to current life circumstances ? ?Progress towards  Goals: ?Ongoing ? ?Interventions: ?Interventions utilized:  Copywriter, advertising, CBT Cognitive Behavioral Therapy, and Supportive Counseling ?Standardized Assessments completed: GAD-7 and PHQ 9 ? ?  04/28/2021  ?  8:44 AM 04/14/2021  ?  9:57 AM 04/13/2021  ?  1:29 PM  ?GAD 7 : Generalized Anxiety Score  ?Nervous, Anxious, on Edge 3 3 3   ?Control/stop worrying 3 3 3   ?Worry too much - different things 3 3 3   ?Trouble relaxing 3 3 3   ?Restless  3 3  ?Easily annoyed or irritable 3 3 3   ?Afraid - awful might happen 3 3 3   ?Total GAD 7 Score  21 21  ?Anxiety Difficulty Extremely difficult  Extremely difficult  ? ?  ? ?  04/28/2021  ?  8:43 AM 04/14/2021  ?  9:56 AM 04/13/2021  ?  1:28 PM  ?Depression screen PHQ 2/9  ?Decreased Interest 3 3 3   ?Down, Depressed, Hopeless 3 3 3   ?PHQ - 2 Score 6 6 6   ?Altered sleeping 3 3 3   ?Tired, decreased energy 3 3 3   ?Change in appetite 3 3 3   ?Feeling bad or failure about yourself  3 2 3   ?Trouble concentrating 2 2 2   ?Moving slowly or fidgety/restless 2 0 2  ?Suicidal thoughts 0 0 0  ?PHQ-9 Score 22 19 22   ?Difficult doing work/chores   Extremely dIfficult  ?  ?Patient and/or Family Response: Pt receptive to tx. Pt receptive to psychoeducation on anxiety and depression. Pt receptive to cognitive restructuring. Pt receptive to incorporating daily self-care and identifying healthy coping skills.  ? ?Patient Centered Plan: ?Patient is on the following Treatment Plan(s): Stress ? ?Assessment: ?Denies SI/HI. Denies auditory/visual hallucinations. Patient currently experiencing stress related to work. Pt appears to  taking self-care by taking time away from work. Pt appears to have to difficulty with not work due to working since childhood.  ? ?Patient may benefit from identifying healthy coping skills. LCSW provided psychoeducation on anxiety and depression. LCSW utilized Chartered certified accountant. LCSW encouraged pt to incorporate daily self-care and identify healthy coping skill.  LCSW discussed with pt her departure from [place]. LCSW will provide pt with counseling resources whom accept pt's insurance. ? ?Plan: ?Follow up with behavioral health clinician on : 05/12/21  ?Behavioral recommendations: Identify additional healthy coping skills and incorporate daily self-care. ?Referral(s): [facility] (In Clinic) and Counselor ?"From scale of 1-10, how likely are you to follow plan?": 10 ? ?Will Schier C Cordera Stineman, LCSW ? ?    ?

## 2021-05-08 NOTE — Progress Notes (Signed)
? ? ?Patient ID: Ronnie Adams, male    DOB: 02/20/1985  MRN: [id number] ? ?CC: Anxiety Follow-Up ? ?Subjective: ?Ronnie Adams is a 37 y.o. male who presents for anxiety follow-up. He is accompanied by his wife. ? ?His concerns today include:  ? ?ANXIETY FOLLOW-UP: ?04/17/2021: ?- Continue Escitalopram as prescribed. ? ?04/22/2021: ?- Counseled patient will need Psychiatry referral to determine how often medically necessary for the employee to be absent from work on an intermittent basis (periodically) including episodes of incapacity, episodic flare-ups including frequency and duration. Explained this is needed so that FMLA can be completed in accuracy. Patient verbalized understanding.  ? ?05/15/2021: ?Doing well on current regimen, no issues/concerns. Reports has not heard from Psychiatry referral. Concerned if he needs FMLA extension past June 2023. Reports trying to find another job prior to that time.  ? ? ?  05/12/2021  ?  9:22 AM 04/28/2021  ?  8:43 AM 04/14/2021  ?  9:56 AM 04/13/2021  ?  1:28 PM  ?Depression screen PHQ 2/9  ?Decreased Interest 3 3 3 3   ?Down, Depressed, Hopeless 3 3 3 3   ?PHQ - 2 Score 6 6 6 6   ?Altered sleeping 3 3 3 3   ?Tired, decreased energy 3 3 3 3   ?Change in appetite 3 3 3 3   ?Feeling bad or failure about yourself  3 3 2 3   ?Trouble concentrating 2 2 2 2   ?Moving slowly or fidgety/restless 2 2 0 2  ?Suicidal thoughts 0 0 0 0  ?PHQ-9 Score 22 22 19 22   ?Difficult doing work/chores    Extremely dIfficult  ? ? ?Patient Active Problem List  ? Diagnosis Date Noted  ? Anxiety and depression 04/14/2021  ? Bulging lumbar disc 01/06/2016  ? Migraine without aura and without status migrainosus, not intractable 01/06/2016  ? Functional gait disorder with tremor 12/17/2015  ? Chronic bilateral low back pain with bilateral sciatica 05/16/2015  ?  ? ?Current Outpatient Medications on File Prior to Visit  ?Medication Sig Dispense Refill  ? hydrOXYzine (ATARAX) 25 MG tablet Take 1 tablet (25 mg total)  by mouth every 6 (six) hours. 12 tablet 0  ? ?No current facility-administered medications on file prior to visit.  ? ? ?Allergies  ?Allergen Reactions  ? Duloxetine Rash  ?  Other reaction(s): Other ?Jittery/incontinence ?Jittery/incontinence ?Jittery/incontinence ?  ? Oxycodone-Acetaminophen Nausea Only  ?  Other reaction(s): Dizziness  ? Prednisone Anxiety  ?  Other reaction(s): Other ?"head feels funny/flush" ?"head feels funny/flush" ?"head feels funny/flush" ?"head feels funny/flush" ?  ? ? ?Social History  ? ?Socioeconomic History  ? Marital status: Married  ?  Spouse name: Not on file  ? Number of children: Not on file  ? Years of education: Not on file  ? Highest education level: Not on file  ?Occupational History  ? Not on file  ?Tobacco Use  ? Smoking status: Every Day  ?  Types: Cigarettes  ?  Passive exposure: Current  ? Smokeless tobacco: Never  ?Vaping Use  ? Vaping Use: Every day  ?Substance and Sexual Activity  ? Alcohol use: Yes  ?  Comment: rare  ? Drug use: Never  ? Sexual activity: Not on file  ?Other Topics Concern  ? Not on file  ?Social History Narrative  ? Not on file  ? ?Social Determinants of Health  ? ?Financial Resource Strain: Not on file  ?Food Insecurity: Not on file  ?Transportation Needs: Not on file  ?Physical Activity: Not on  file  ?Stress: Not on file  ?Social Connections: Not on file  ?Intimate Partner Violence: Not on file  ? ? ?History reviewed. No pertinent family history. ? ?Past Surgical History:  ?Procedure Laterality Date  ? BACK SURGERY    ? ? ?ROS: ?Review of Systems ?Negative except as stated above ? ?PHYSICAL EXAM: ?BP 118/80 (BP Location: Left Arm, Patient Position: Sitting, Cuff Size: Large)   Pulse 75   Temp 98.3 ?F (36.8 ?C)   Resp 18   Ht 6' 0.01" (1.829 m)   Wt 182 lb (82.6 kg)   SpO2 99%   BMI 24.68 kg/m?  ? ?Physical Exam ?HENT:  ?   Head: Normocephalic and atraumatic.  ?Eyes:  ?   Extraocular Movements: Extraocular movements intact.  ?    Conjunctiva/sclera: Conjunctivae normal.  ?   Pupils: Pupils are equal, round, and reactive to light.  ?Cardiovascular:  ?   Rate and Rhythm: Normal rate and regular rhythm.  ?   Pulses: Normal pulses.  ?   Heart sounds: Normal heart sounds.  ?Pulmonary:  ?   Effort: Pulmonary effort is normal.  ?   Breath sounds: Normal breath sounds.  ?Musculoskeletal:  ?   Cervical back: Normal range of motion and neck supple.  ?Neurological:  ?   General: No focal deficit present.  ?   Mental Status: He is alert and oriented to person, place, and time.  ?Psychiatric:     ?   Mood and Affect: Mood normal.     ?   Behavior: Behavior normal.  ? ?ASSESSMENT AND PLAN: ?1. Generalized anxiety disorder: ?- Patient denies thoughts of self-harm, suicidal ideations, homicidal ideations. ?- Continue Escitalopram as prescribed.  ?- Referral to Psychiatry for further evaluation and management.  ?- Follow-up with primary provider as needed.  ?- escitalopram (LEXAPRO) 10 MG tablet; Take 1 tablet (10 mg total) by mouth daily.  Dispense: 30 tablet; Refill: 3 ?- Ambulatory referral to Psychiatry ? ? ? ?Patient was given the opportunity to ask questions.  Patient verbalized understanding of the plan and was able to repeat key elements of the plan. Patient was given clear instructions to go to Emergency Department or return to medical center if symptoms don't improve, worsen, or new problems develop.The patient verbalized understanding. ? ? ?Orders Placed This Encounter  ?Procedures  ? Ambulatory referral to Psychiatry  ? ? ?Requested Prescriptions  ? ?Signed Prescriptions Disp Refills  ? escitalopram (LEXAPRO) 10 MG tablet 30 tablet 3  ?  Sig: Take 1 tablet (10 mg total) by mouth daily.  ? ? ?Follow-up with primary provider as scheduled.  ? ?Camillia Herter, NP  ?

## 2021-05-12 ENCOUNTER — Ambulatory Visit: Payer: BC Managed Care – PPO | Admitting: Clinical

## 2021-05-12 DIAGNOSIS — F4323 Adjustment disorder with mixed anxiety and depressed mood: Secondary | ICD-10-CM | POA: Diagnosis not present

## 2021-05-12 NOTE — BH Specialist Note (Signed)
Integrated Behavioral Health Follow Up In-Person Visit ? ?MRN: [id number] ?Name: Ronnie Adams ? ?Number of Integrated Behavioral Health Clinician visits: 3- Third Visit ? ?Session Start time: 0915 ?  ?Session End time: 1010 ? ?Total time in minutes: 55 ? ? ?Types of Service: Individual psychotherapy ? ?Interpretor:No. Interpretor Name and Language: N/A ? ?Subjective: ?Ronnie Adams is a 36 y.o. male accompanied by  self ?Patient was referred by Angus Seller, NP for stress and anxiety. ?Patient reports the following symptoms/concerns: Reports feeling depressed, anxious, and worrying. Reports that he is still out of work as his leave ends in June. 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 Reports that he wants to be in a career that does not drain him. Reports that he also has been spending more time with family.  ?Duration of problem: 1 month; Severity of problem: moderate ? ?Objective: ?Mood: Anxious and Affect: Appropriate ?Risk of harm to self or others: No plan to harm self or others ? ?Life Context: ?Family and Social: Pt is married with two children. ?School/Work: Pt is employed full time and is searching for new employment. 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 ?Self-Care: Pt has limited self-care. Pt currently prescribed lexapro.  ?Life Changes: Pt has been stressed due to work. ? ?Patient and/or Family's Strengths/Protective Factors: ?Concrete supports in place (healthy food, safe environments, etc.) and Sense of purpose ? ?Goals Addressed: ?Patient will: ? Reduce symptoms of: anxiety, depression, and stress  ? Increase knowledge and/or ability of: coping skills and stress reduction  ? Demonstrate ability to: Increase healthy adjustment to current life circumstances ? ?Progress towards Goals: ?Ongoing ? ?Interventions: ?Interventions utilized:  CBT Cognitive Behavioral Therapy and Supportive Counseling ?Standardized Assessments completed: GAD-7 and PHQ 9 ? ?  05/12/2021  ?   9:22 AM 04/28/2021  ?  8:44 AM 04/14/2021  ?  9:57 AM 04/13/2021  ?  1:29 PM  ?GAD 7 : Generalized Anxiety Score  ?Nervous, Anxious, on Edge 3 3 3 3   ?Control/stop worrying 3 3 3 3   ?Worry too much - different things 3 3 3 3   ?Trouble relaxing 3 3 3 3   ?Restless 3  3 3   ?Easily annoyed or irritable 3 3 3 3   ?Afraid - awful might happen 3 3 3 3   ?Total GAD 7 Score 21  21 21   ?Anxiety Difficulty  Extremely difficult  Extremely difficult  ? ?  ? ?  05/12/2021  ?  9:22 AM 04/28/2021  ?  8:43 AM 04/14/2021  ?  9:56 AM 04/13/2021  ?  1:28 PM  ?Depression screen PHQ 2/9  ?Decreased Interest 3 3 3 3   ?Down, Depressed, Hopeless 3 3 3 3   ?PHQ - 2 Score 6 6 6 6   ?Altered sleeping 3 3 3 3   ?Tired, decreased energy 3 3 3 3   ?Change in appetite 3 3 3 3   ?Feeling bad or failure about yourself  3 3 2 3   ?Trouble concentrating 2 2 2 2   ?Moving slowly or fidgety/restless 2 2 0 2  ?Suicidal thoughts 0 0 0 0  ?PHQ-9 Score 22 22 19 22   ?Difficult doing work/chores    Extremely dIfficult  ?  ?Patient and/or Family Response: Pt receptive to tx. Pt receptive to psychoeducation on anxiety. Pt receptive to cognitive restructuring. Pt receptive to incorporating daily relaxation.  ? ?Patient Centered Plan: ?Patient is on the following Treatment Plan(s): Stresss ? ?Assessment: ?Denies SI/HI. Patient currently experiencing stress related to  work. Pt appears to be spending more time with family. Pt is having difficulty with not working right now however has been incorporating more relaxation. ? ?Patient may benefit from continuing daily relaxation. LCSW provided psychoeducation on anxiety and the benefits of having a decrease in stress in employment. LCSW utilized Chartered certified accountant. LCSW encouraged pt to continue daily relaxation. LCSW provided pt with counseling resources whom accept pt's insurance. ? ?Plan: ?Follow up with behavioral health clinician on : 05/19/21 ?Behavioral recommendations: Continue daily relaxation.  ?Referral(s): Integrated  [facility] (In Clinic) and Counselor ?"From scale of 1-10, how likely are you to follow plan?": 10 ? ?Ritika Hellickson C Marnee Sherrard, LCSW ? ? ?

## 2021-05-15 ENCOUNTER — Ambulatory Visit: Payer: BC Managed Care – PPO | Admitting: Family

## 2021-05-15 ENCOUNTER — Encounter: Payer: Self-pay | Admitting: Family

## 2021-05-15 VITALS — BP 118/80 | HR 75 | Temp 98.3°F | Resp 18 | Ht 72.01 in | Wt 182.0 lb

## 2021-05-15 DIAGNOSIS — F411 Generalized anxiety disorder: Secondary | ICD-10-CM

## 2021-05-15 MED ORDER — ESCITALOPRAM OXALATE 10 MG PO TABS: 10.0000 mg | ORAL_TABLET | Freq: Every day | ORAL | 3 refills | Status: AC

## 2021-05-15 NOTE — Progress Notes (Signed)
Pt presents for anxiety follow-up  °

## 2021-05-19 ENCOUNTER — Ambulatory Visit (INDEPENDENT_AMBULATORY_CARE_PROVIDER_SITE_OTHER): Payer: BC Managed Care – PPO | Admitting: Clinical

## 2021-05-19 DIAGNOSIS — F4323 Adjustment disorder with mixed anxiety and depressed mood: Secondary | ICD-10-CM

## 2021-05-19 NOTE — BH Specialist Note (Signed)
Integrated Behavioral Health Follow Up In-Person Visit ? ?MRN: [id number] ?Name: Devyon Keator ? ?Number of Integrated Behavioral Health Clinician visits: 4- Fourth Visit ? ?Session Start time: 1540 ?  ?Session End time: 1640 ? ?Total time in minutes: 60 ? ? ?Types of Service: Individual psychotherapy ? ?Interpretor:No. Interpretor Name and Language: N/A ? ?Subjective: ?Hosey Burmester is a 36 y.o. male accompanied by  self ?Patient was referred by Angus Seller, NP for stress and anxiety. ?Patient reports the following symptoms/concerns: Reports feeling anxious, excessive worrying, and feeling depressed. Reports that he worries about "failing and losing everything". 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 ?Duration of problem: 2 months; Severity of problem: moderate ? ?Objective: ?Mood: Anxious and Affect: Appropriate ?Risk of harm to self or others: No plan to harm self or others ? ?Life Context: ?Family and Social: Pt is married with two children. ?School/Work: Pt is employed full time and is searching for new employment. 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 ?Self-Care: Pt has limited self-care. Pt currently prescribed lexapro.  ?Life Changes: Pt has been stressed due to work. ?(No changes to life context) ? ?Patient and/or Family's Strengths/Protective Factors: ?Concrete supports in place (healthy food, safe environments, etc.) and Sense of purpose ? ?Goals Addressed: ?Patient will: ? Reduce symptoms of: anxiety, depression, and stress  ? Increase knowledge and/or ability of: coping skills and stress reduction  ? Demonstrate ability to: Increase healthy adjustment to current life circumstances ? ?Progress towards Goals: ?Ongoing ? ?Interventions: ?Interventions utilized:  Copywriter, advertising, CBT Cognitive Behavioral Therapy, and Supportive Counseling ?Standardized Assessments completed: GAD-7 and PHQ 9 ? ?  05/19/2021  ?  3:49  PM 05/12/2021  ?  9:22 AM 04/28/2021  ?  8:44 AM 04/14/2021  ?  9:57 AM  ?GAD 7 : Generalized Anxiety Score  ?Nervous, Anxious, on Edge 3 3 3 3   ?Control/stop worrying 3 3 3 3   ?Worry too much - different things 3 3 3 3   ?Trouble relaxing 3 3 3 3   ?Restless 3 3  3   ?Easily annoyed or irritable 3 3 3 3   ?Afraid - awful might happen 3 3 3 3   ?Total GAD 7 Score 21 21  21   ?Anxiety Difficulty   Extremely difficult   ? ?  ? ?  05/19/2021  ?  3:49 PM 05/12/2021  ?  9:22 AM 04/28/2021  ?  8:43 AM 04/14/2021  ?  9:56 AM 04/13/2021  ?  1:28 PM  ?Depression screen PHQ 2/9  ?Decreased Interest 3 3 3 3 3   ?Down, Depressed, Hopeless 3 3 3 3 3   ?PHQ - 2 Score 6 6 6 6 6   ?Altered sleeping 3 3 3 3 3   ?Tired, decreased energy 3 3 3 3 3   ?Change in appetite 3 3 3 3 3   ?Feeling bad or failure about yourself  3 3 3 2 3   ?Trouble concentrating 3 2 2 2 2   ?Moving slowly or fidgety/restless 3 2 2  0 2  ?Suicidal thoughts 0 0 0 0 0  ?PHQ-9 Score 24 22 22 19 22   ?Difficult doing work/chores     Extremely dIfficult  ? ?Patient and/or Family Response: Pt receptive to cognitive restructuring to decrease negative thoughts and assisted with cognitive processing.  ? ?Patient Centered Plan: ?Patient is on the following Treatment Plan(s): Stress ? ?Assessment: ?Denies SI/HI. Patient currently experiencing stress. Pt appears to worry  excessively and assumes the worst at times. Pt has difficulty focusing on the present moment.  ? ?Patient may benefit from outpatient therapy. LCSW utilized cognitive restructuring to decrease negative thoughts. LCSW encouraged pt to focus on the moment. LCSW encouraged pt to utilize daily relaxation. LCSW provided pt with counseling resources and encouraged pt to establish for therapy and pt is receptive. ? ?Plan: ?Follow up with behavioral health clinician on : Utilize counseling resources to establish for therapy. ?Behavioral recommendations: Utilize daily relaxation. ?Referral(s): Counselor ?"From scale of 1-10, how likely  are you to follow plan?": 10 ? ?Autry Prust C Tatianna Ibbotson, LCSW ? ?             ?

## 2021-08-04 ENCOUNTER — Telehealth (HOSPITAL_COMMUNITY): Payer: BC Managed Care – PPO | Admitting: Psychiatry

## 2022-03-25 IMAGING — CR DG CHEST 2V
2 series · 2 of 2 positions shown · non-contrast
Comparison: None.

CLINICAL DATA: Acute chest pain.

EXAM:
CHEST - 2 VIEW

[w chest pa]
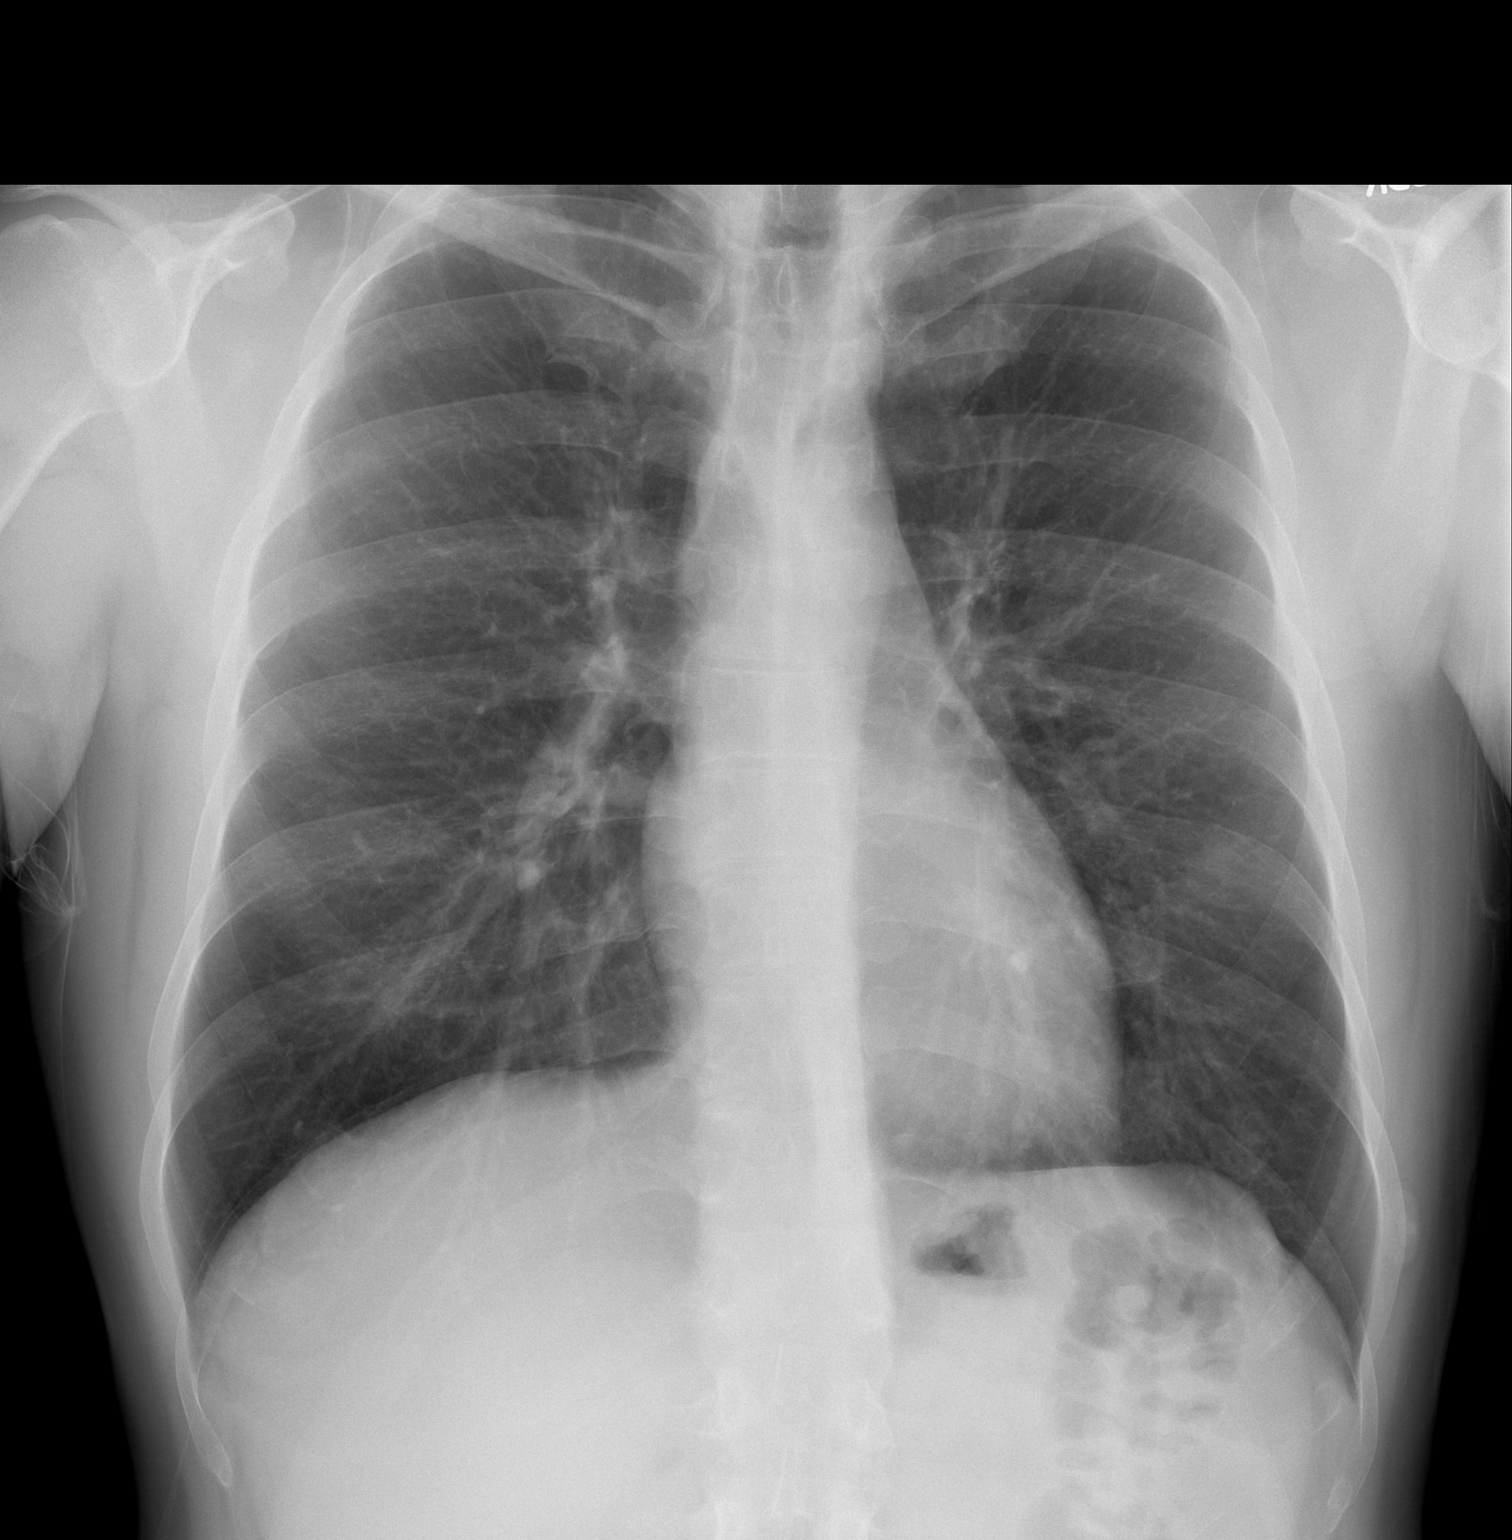

[w chest lat]
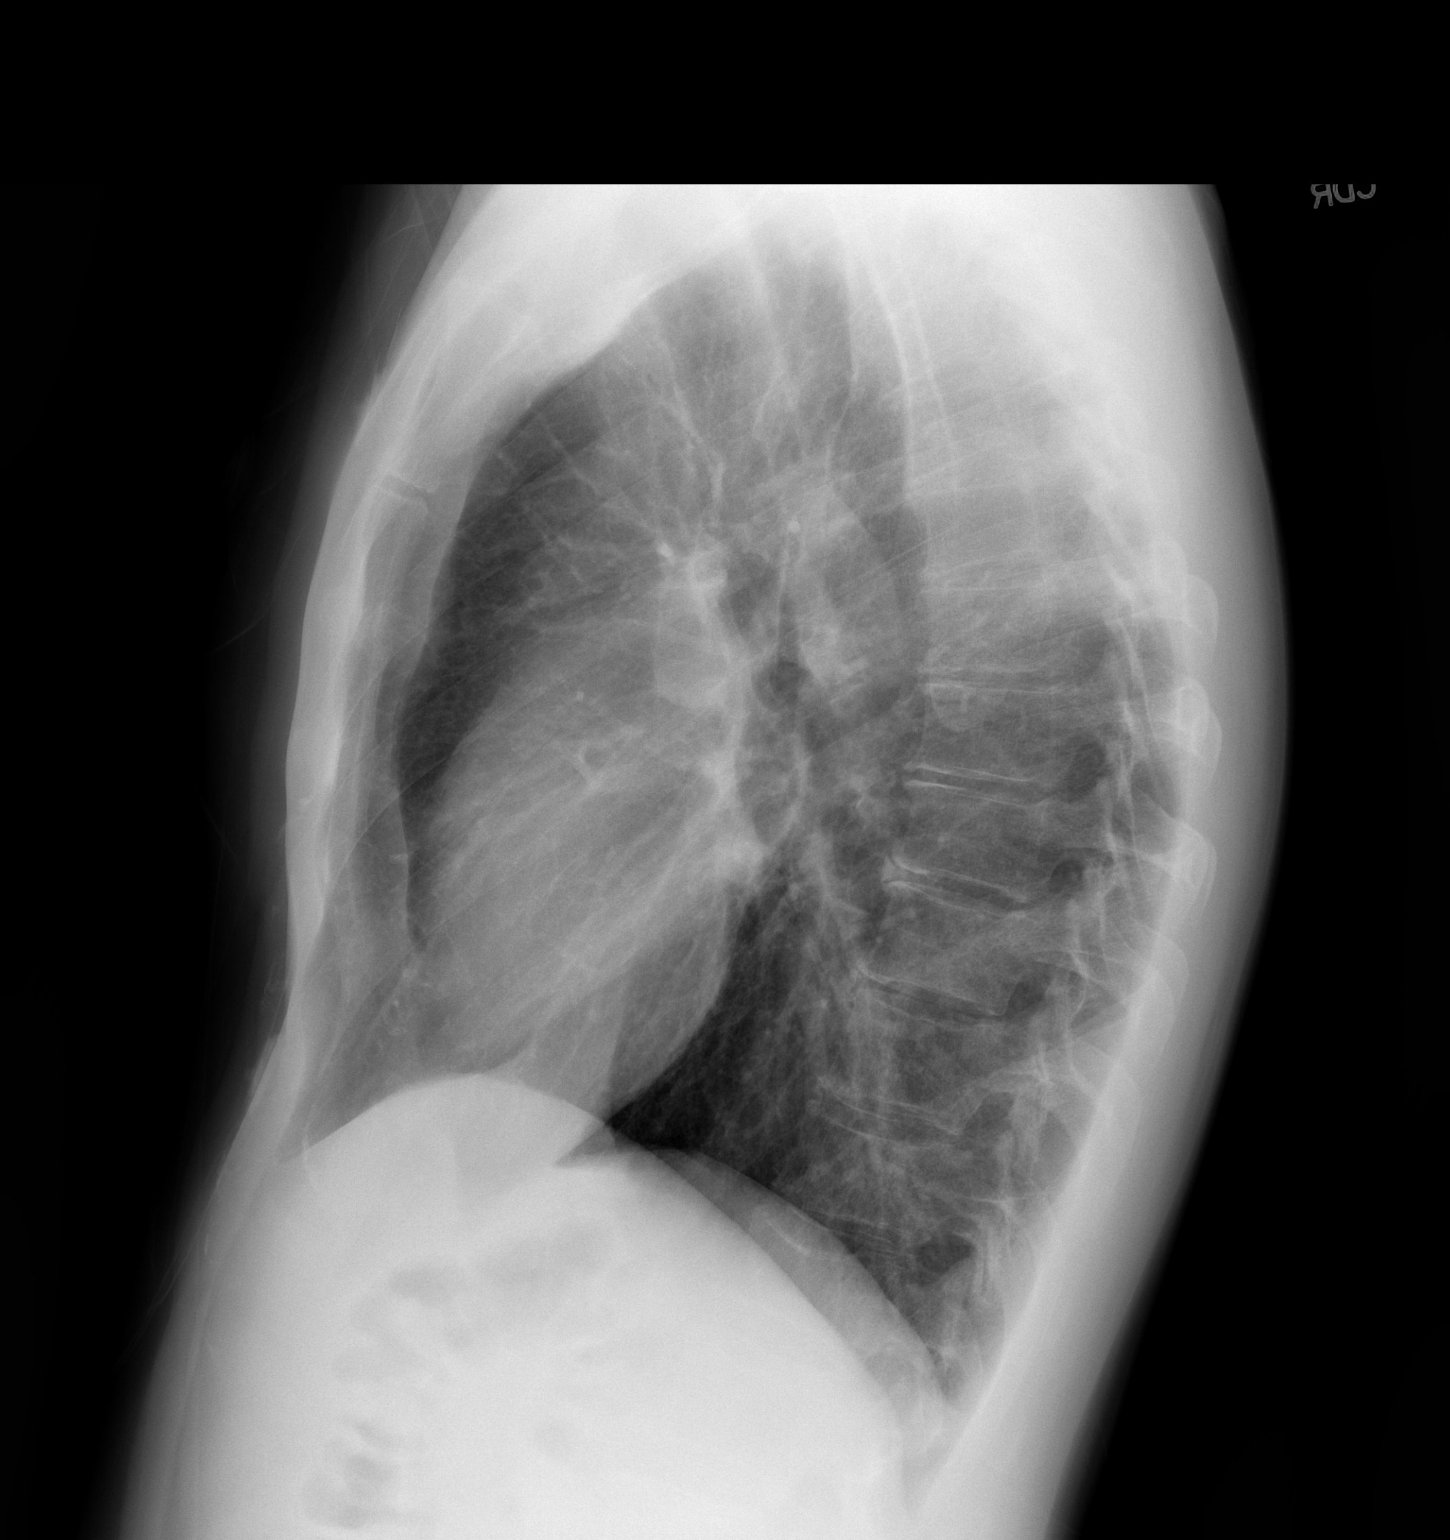

[2 of 2 positions shown; findings below may reference images not displayed]

FINDINGS: The cardiomediastinal silhouette is unremarkable.

There is no evidence of focal airspace disease, pulmonary edema,
suspicious pulmonary nodule/mass, pleural effusion, or pneumothorax.

No acute bony abnormalities are identified.
IMPRESSION: No active cardiopulmonary disease.
